# Patient Record
Sex: Male | Born: 1975 | Race: White | Hispanic: No | Marital: Married | Smoking: Never smoker
Health system: Southern US, Community
[De-identification: ages and names within clinical notes are randomized; demographics above are authoritative.]

## PROBLEM LIST (undated history)

## (undated) DIAGNOSIS — C801 Malignant (primary) neoplasm, unspecified: Secondary | ICD-10-CM

## (undated) DIAGNOSIS — Z8619 Personal history of other infectious and parasitic diseases: Secondary | ICD-10-CM

## (undated) DIAGNOSIS — Z8614 Personal history of Methicillin resistant Staphylococcus aureus infection: Secondary | ICD-10-CM

## (undated) DIAGNOSIS — G43909 Migraine, unspecified, not intractable, without status migrainosus: Secondary | ICD-10-CM

## (undated) HISTORY — DX: Malignant (primary) neoplasm, unspecified: C80.1

## (undated) HISTORY — DX: Personal history of Methicillin resistant Staphylococcus aureus infection: Z86.14

## (undated) HISTORY — DX: Personal history of other infectious and parasitic diseases: Z86.19

## (undated) HISTORY — DX: Migraine, unspecified, not intractable, without status migrainosus: G43.909

---

## 2003-09-16 DIAGNOSIS — C801 Malignant (primary) neoplasm, unspecified: Secondary | ICD-10-CM

## 2003-09-16 HISTORY — DX: Malignant (primary) neoplasm, unspecified: C80.1

## 2003-10-06 ENCOUNTER — Ambulatory Visit (HOSPITAL_COMMUNITY): Admission: RE | Admit: 2003-10-06 | Discharge: 2003-10-06 | Payer: Self-pay | Admitting: Urology

## 2003-10-06 ENCOUNTER — Ambulatory Visit (HOSPITAL_BASED_OUTPATIENT_CLINIC_OR_DEPARTMENT_OTHER): Admission: RE | Admit: 2003-10-06 | Discharge: 2003-10-06 | Payer: Self-pay | Admitting: Urology

## 2003-10-06 ENCOUNTER — Encounter (INDEPENDENT_AMBULATORY_CARE_PROVIDER_SITE_OTHER): Payer: Self-pay | Admitting: Specialist

## 2003-10-08 ENCOUNTER — Encounter: Admission: RE | Admit: 2003-10-08 | Discharge: 2003-10-08 | Payer: Self-pay | Admitting: Urology

## 2003-10-28 ENCOUNTER — Encounter: Admission: RE | Admit: 2003-10-28 | Discharge: 2003-10-28 | Payer: Self-pay | Admitting: Oncology

## 2003-11-03 ENCOUNTER — Ambulatory Visit (HOSPITAL_COMMUNITY): Admission: RE | Admit: 2003-11-03 | Discharge: 2003-11-03 | Payer: Self-pay | Admitting: Oncology

## 2003-11-16 ENCOUNTER — Ambulatory Visit: Payer: Self-pay | Admitting: Oncology

## 2004-01-03 ENCOUNTER — Encounter: Admission: RE | Admit: 2004-01-03 | Discharge: 2004-01-03 | Payer: Self-pay | Admitting: Oncology

## 2004-01-06 ENCOUNTER — Ambulatory Visit: Payer: Self-pay | Admitting: Oncology

## 2004-02-24 ENCOUNTER — Encounter: Admission: RE | Admit: 2004-02-24 | Discharge: 2004-02-24 | Payer: Self-pay | Admitting: Oncology

## 2004-03-01 ENCOUNTER — Ambulatory Visit: Payer: Self-pay | Admitting: Oncology

## 2004-04-20 ENCOUNTER — Encounter: Admission: RE | Admit: 2004-04-20 | Discharge: 2004-04-20 | Payer: Self-pay | Admitting: Oncology

## 2004-06-05 ENCOUNTER — Ambulatory Visit: Payer: Self-pay | Admitting: Oncology

## 2004-06-20 ENCOUNTER — Encounter: Admission: RE | Admit: 2004-06-20 | Discharge: 2004-06-20 | Payer: Self-pay | Admitting: Oncology

## 2004-08-07 ENCOUNTER — Ambulatory Visit: Payer: Self-pay | Admitting: Oncology

## 2004-09-28 ENCOUNTER — Encounter: Admission: RE | Admit: 2004-09-28 | Discharge: 2004-09-28 | Payer: Self-pay | Admitting: Oncology

## 2004-12-18 ENCOUNTER — Ambulatory Visit: Payer: Self-pay | Admitting: Oncology

## 2005-02-01 ENCOUNTER — Encounter: Admission: RE | Admit: 2005-02-01 | Discharge: 2005-02-01 | Payer: Self-pay | Admitting: Oncology

## 2005-04-17 ENCOUNTER — Ambulatory Visit: Payer: Self-pay | Admitting: Oncology

## 2005-04-18 LAB — COMPREHENSIVE METABOLIC PANEL
ALT: 11 U/L (ref 0–40)
AST: 14 U/L (ref 0–37)
Albumin: 4.9 g/dL (ref 3.5–5.2)
BUN: 21 mg/dL (ref 6–23)
CO2: 25 mEq/L (ref 19–32)
Total Protein: 7.5 g/dL (ref 6.0–8.3)

## 2005-06-03 IMAGING — CT CT PELVIS W/ CM
1 of 3 series · 14 of 32 positions shown, 19 images · IV contrast (GASTRO. & [ID] OMNI  300)
Comparison: none

CLINICAL DATA: Staging testicular cancer.
ABDOMINAL CT WITH CONTRAST ? 10/28/03:

[Series 2: routine abdomen · axial · 0.70mm/px · z∈[-335,+10]mm · 14 of 79 slices shown, 19 images]
[im 5/79  soft-tissue]
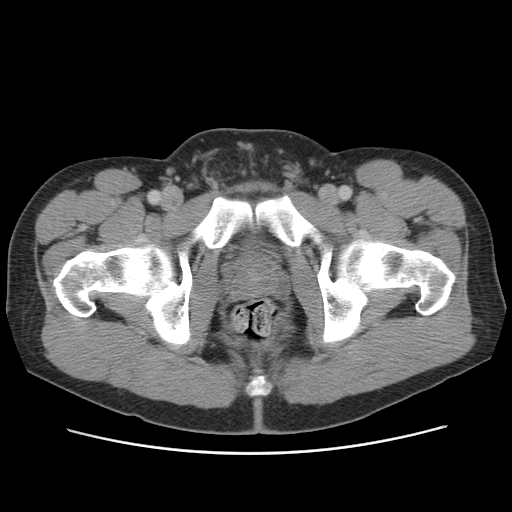
[im 5/79  bone]
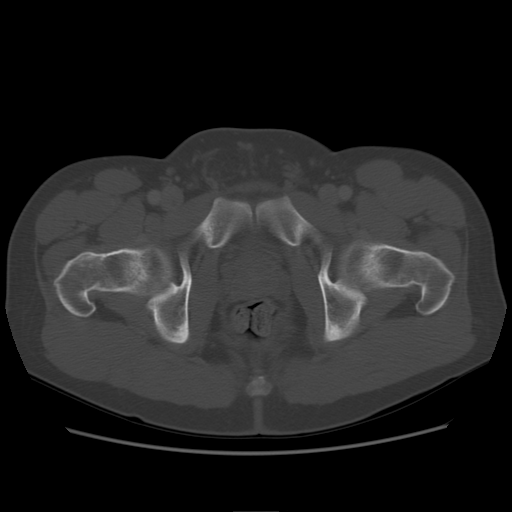
[im 9/79  soft-tissue]
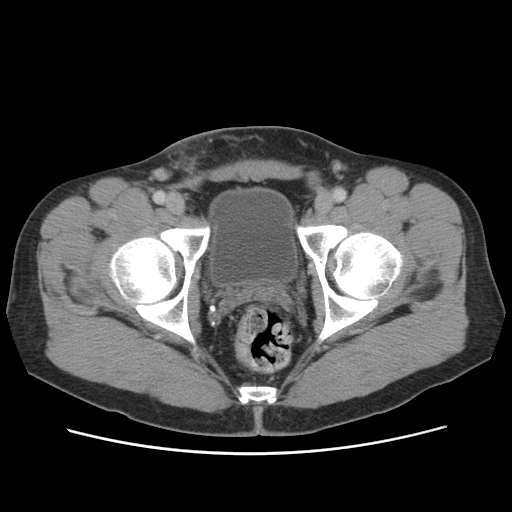
[im 18/79  soft-tissue]
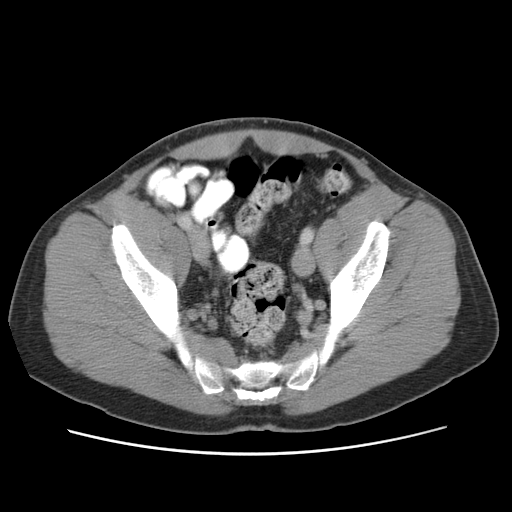
[im 22/79  soft-tissue]
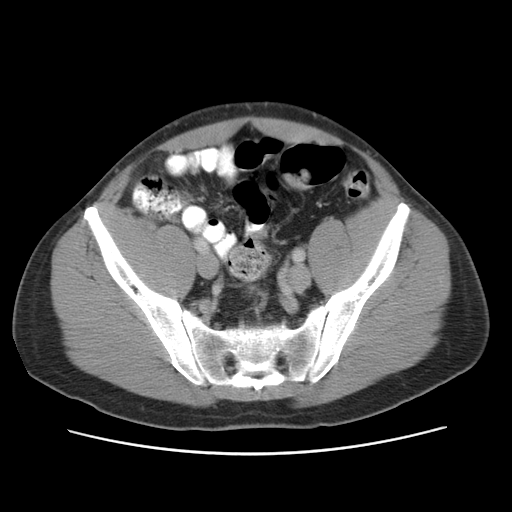
[im 27/79  soft-tissue]
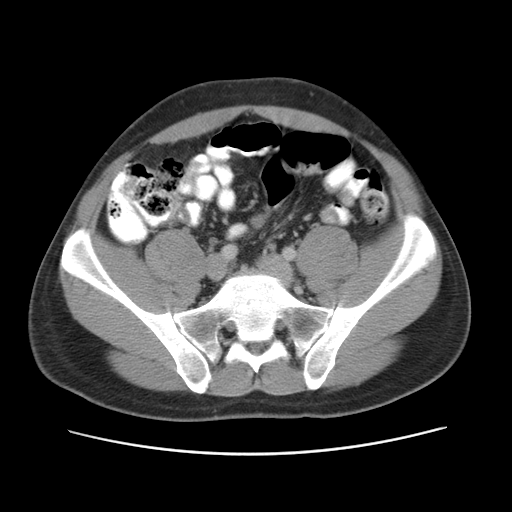
[im 35/79  soft-tissue]
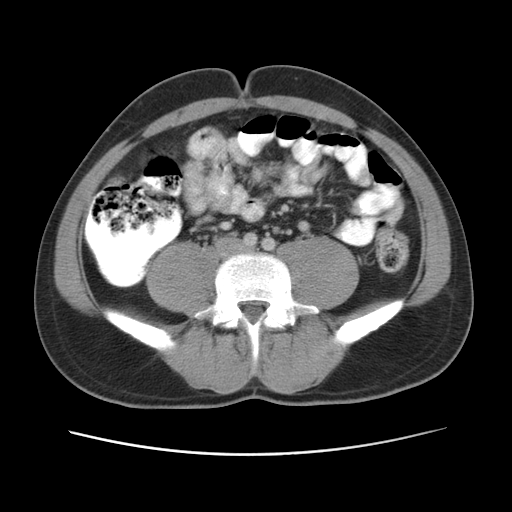
[im 40/79  soft-tissue]
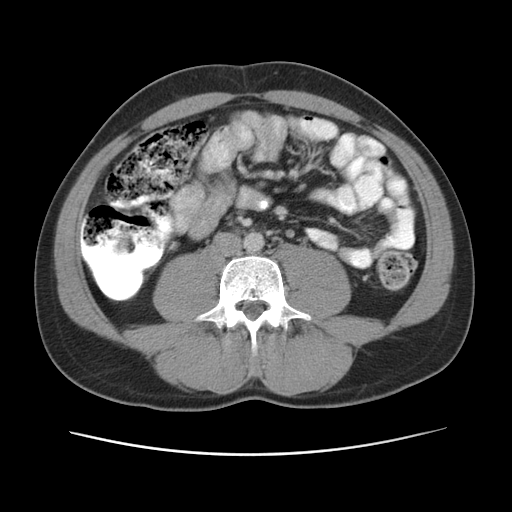
[im 44/79  soft-tissue]
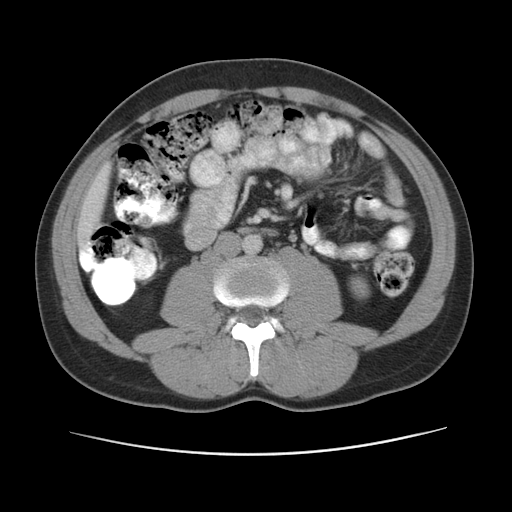
[im 53/79  soft-tissue]
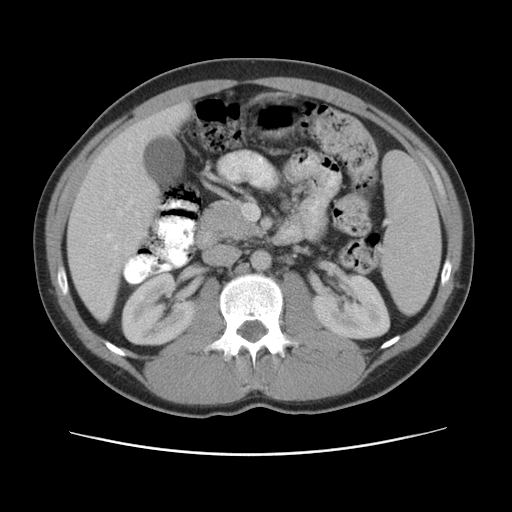
[im 53/79  bone]
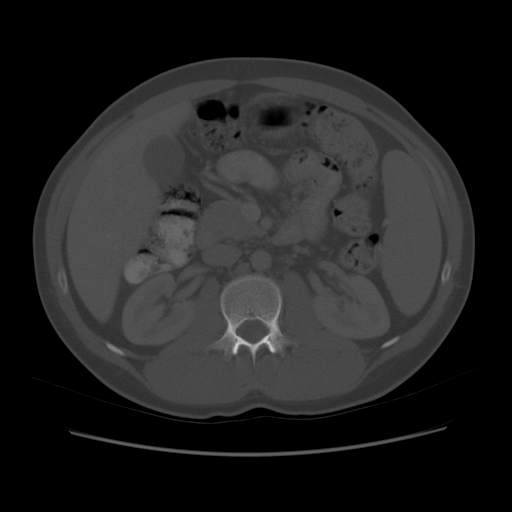
[im 57/79  soft-tissue]
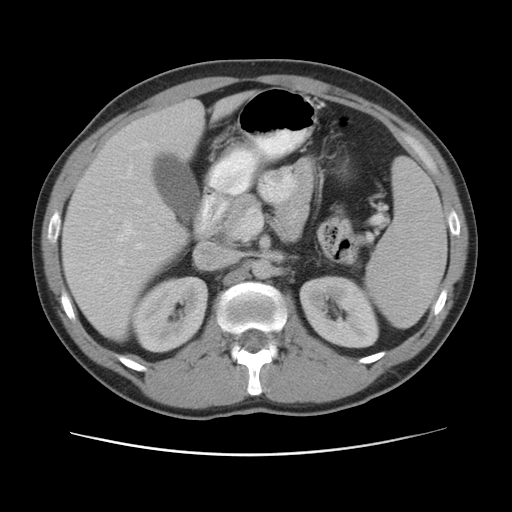
[im 61/79  soft-tissue]
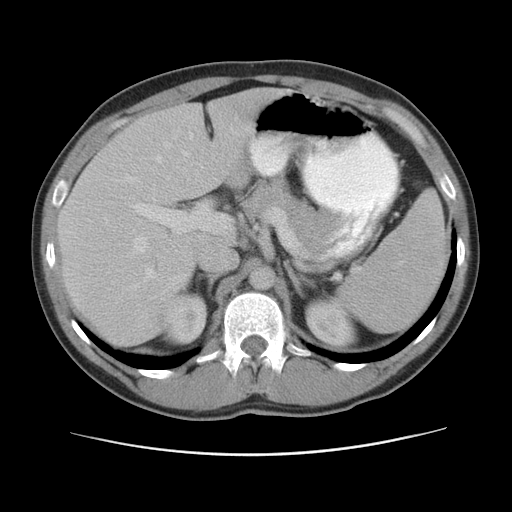
[im 61/79  lung]
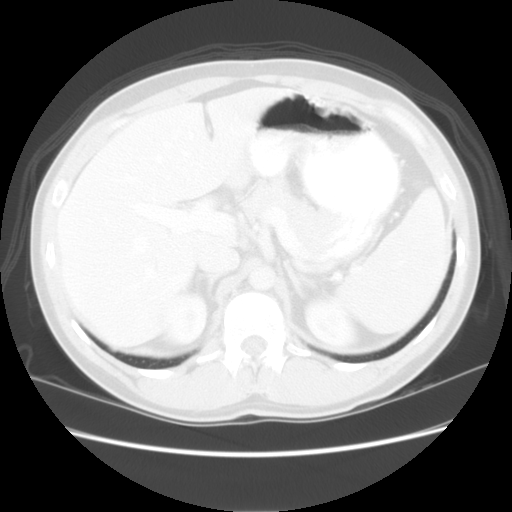
[im 66/79  lung]
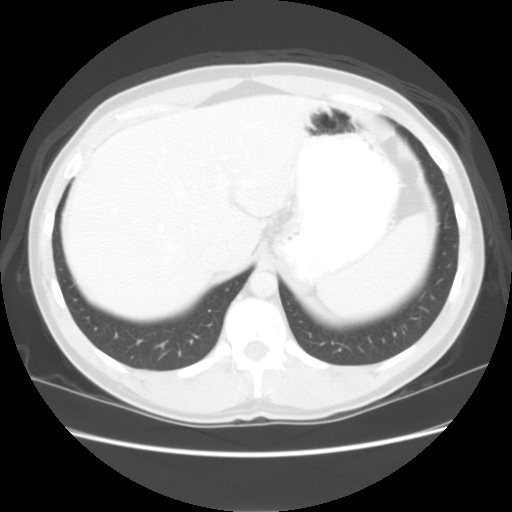
[im 70/79  soft-tissue]
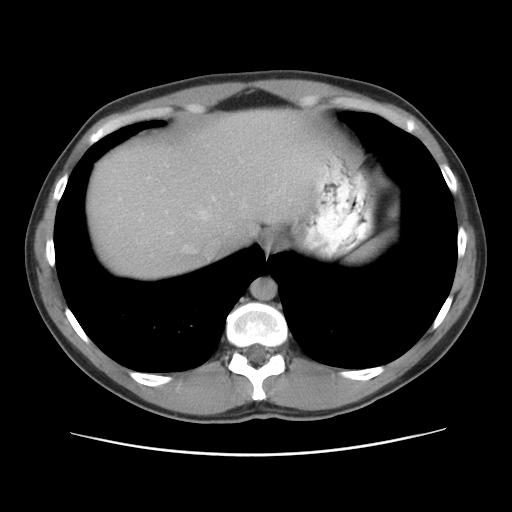
[im 70/79  lung]
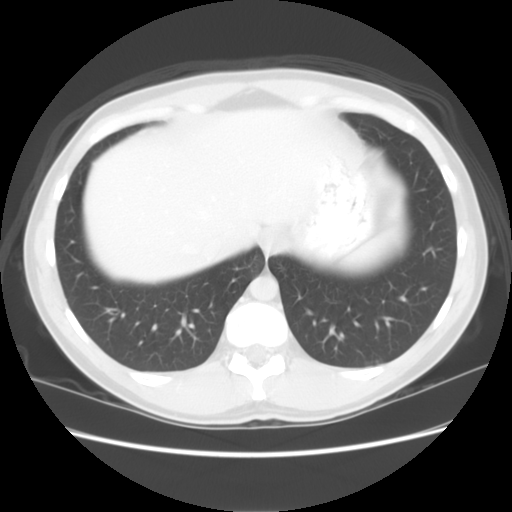
[im 74/79  soft-tissue]
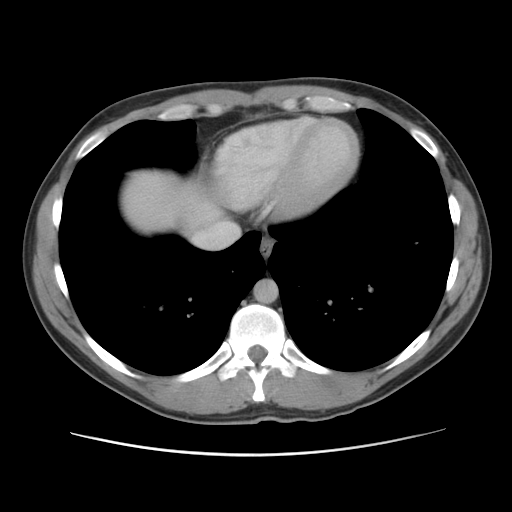
[im 74/79  lung]
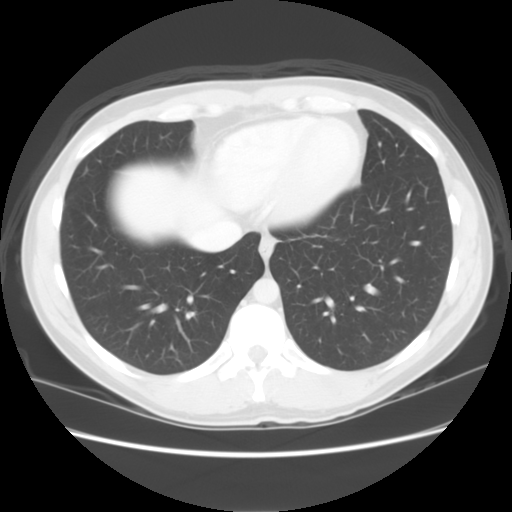

[14 of 32 positions shown; findings below may reference images not displayed]

FINDINGS: Following oral contrast and IV administration of 100 cc Omnipaque 300, multidetector spiral axial images were obtained through the abdomen and comparison made with previous abdominal CT of 10/08/03.  Since prior study, there is resolution of previous left periaortic adenopathy.  Marked regression in more inferior retroperitoneal adenopathy previously measured 1.4 x 1.9 cm and currently measures 0.6 cm AP by 1.2 cm wide (image 42).  No progressive adenopathy is seen.  Remaining abdominal organs appear normal.   No osseous lesion is seen.
IMPRESSION: Since 10/08/03:
[DATE].  Resolution of previous left retroperitoneal and marked regression of more inferior retroperitoneal adenopathy with no locally progressive nor new metastatic disease.
2.  Otherwise normal. 
PELVIC CT WITH CONTRAST ? 10/28/03:
FINDINGS: Routine spiral CT of the pelvis was performed. Omnipaque intravenous contrast and oral contrast were administered. 

The pelvic structures are normal in appearance. There is no evidence of masses, adenopathy, inflammatory process, or abnormal fluid collections.   esolution of recent postoperative changes due to prior right orchiectomy with no other significant change since 10/08/03.

IMPRESSION
Normal postoperative pelvis CT.

## 2005-07-13 ENCOUNTER — Ambulatory Visit: Payer: Self-pay | Admitting: Oncology

## 2005-07-17 LAB — COMPREHENSIVE METABOLIC PANEL
ALT: 14 U/L (ref 0–40)
Alkaline Phosphatase: 63 U/L (ref 39–117)
BUN: 18 mg/dL (ref 6–23)
Calcium: 9.4 mg/dL (ref 8.4–10.5)
Chloride: 104 mEq/L (ref 96–112)
Creatinine, Ser: 1.21 mg/dL (ref 0.40–1.50)
Sodium: 141 mEq/L (ref 135–145)
Total Bilirubin: 1.1 mg/dL (ref 0.3–1.2)

## 2005-07-19 ENCOUNTER — Encounter: Admission: RE | Admit: 2005-07-19 | Discharge: 2005-07-19 | Payer: Self-pay | Admitting: Oncology

## 2005-12-25 ENCOUNTER — Ambulatory Visit: Payer: Self-pay | Admitting: Oncology

## 2006-01-03 ENCOUNTER — Encounter: Admission: RE | Admit: 2006-01-03 | Discharge: 2006-01-03 | Payer: Self-pay | Admitting: Oncology

## 2006-01-05 LAB — BASIC METABOLIC PANEL
BUN: 16 mg/dL (ref 6–23)
CO2: 30 mEq/L (ref 19–32)
Calcium: 9.8 mg/dL (ref 8.4–10.5)

## 2006-06-25 ENCOUNTER — Ambulatory Visit: Payer: Self-pay | Admitting: Oncology

## 2006-06-29 LAB — COMPREHENSIVE METABOLIC PANEL
AST: 14 U/L (ref 0–37)
Alkaline Phosphatase: 57 U/L (ref 39–117)
BUN: 23 mg/dL (ref 6–23)
Calcium: 9.6 mg/dL (ref 8.4–10.5)
Chloride: 104 mEq/L (ref 96–112)
Creatinine, Ser: 1.32 mg/dL (ref 0.40–1.50)

## 2006-07-04 ENCOUNTER — Encounter: Admission: RE | Admit: 2006-07-04 | Discharge: 2006-07-04 | Payer: Self-pay | Admitting: Oncology

## 2006-12-24 ENCOUNTER — Ambulatory Visit: Payer: Self-pay | Admitting: Oncology

## 2006-12-25 ENCOUNTER — Encounter: Admission: RE | Admit: 2006-12-25 | Discharge: 2006-12-25 | Payer: Self-pay | Admitting: Oncology

## 2007-02-12 ENCOUNTER — Ambulatory Visit: Payer: Self-pay | Admitting: Oncology

## 2007-04-08 ENCOUNTER — Ambulatory Visit: Payer: Self-pay | Admitting: Oncology

## 2007-09-16 HISTORY — PX: RADICAL ORCHIECTOMY: SHX2285

## 2007-10-07 ENCOUNTER — Ambulatory Visit: Payer: Self-pay | Admitting: Oncology

## 2007-10-12 LAB — BASIC METABOLIC PANEL
CO2: 25 mEq/L (ref 19–32)
Chloride: 103 mEq/L (ref 96–112)
Glucose, Bld: 89 mg/dL (ref 70–99)
Potassium: 3.7 mEq/L (ref 3.5–5.3)
Sodium: 140 mEq/L (ref 135–145)

## 2007-10-12 LAB — AFP TUMOR MARKER: AFP-Tumor Marker: 4.6 ng/mL (ref 0.0–8.0)

## 2008-04-06 ENCOUNTER — Ambulatory Visit: Payer: Self-pay | Admitting: Oncology

## 2008-09-24 ENCOUNTER — Encounter: Admission: RE | Admit: 2008-09-24 | Discharge: 2008-09-24 | Payer: Self-pay

## 2008-10-04 ENCOUNTER — Ambulatory Visit: Payer: Self-pay | Admitting: Oncology

## 2009-04-01 ENCOUNTER — Ambulatory Visit: Payer: Self-pay | Admitting: Oncology

## 2009-04-15 DIAGNOSIS — Z8614 Personal history of Methicillin resistant Staphylococcus aureus infection: Secondary | ICD-10-CM

## 2009-04-15 HISTORY — DX: Personal history of Methicillin resistant Staphylococcus aureus infection: Z86.14

## 2009-11-14 ENCOUNTER — Ambulatory Visit: Payer: Self-pay | Admitting: Oncology

## 2010-02-04 ENCOUNTER — Encounter: Payer: Self-pay | Admitting: Oncology

## 2010-02-05 ENCOUNTER — Encounter: Payer: Self-pay | Admitting: Oncology

## 2010-06-02 NOTE — Op Note (Signed)
NAMELLEWYN, HEAP                 ACCOUNT NO.:  [id number]   MEDICAL RECORD NO.:  [id number]          PATIENT TYPE:  AMB   LOCATION:  [facility]                         FACILITY:  [facility]   PHYSICIAN:  Valetta Fuller, M.D.  DATE OF BIRTH:  1975/05/14   DATE OF PROCEDURE:  10/06/2003  DATE OF DISCHARGE:                                 OPERATIVE REPORT   PREOPERATIVE DIAGNOSIS:  Right testicular mass.   POSTOPERATIVE DIAGNOSIS:  Right testicular mass.   PROCEDURE PERFORMED:  Right radical orchiectomy.   SURGEON:  Claudette Laws, M.D.   RESIDENT:  Thyra Breed, M.D.   ANESTHESIA:  General endotracheal anesthesia.   SPECIMENS:  Right testicle and cord structures.   DRAINS:  None.   ESTIMATED BLOOD LOSS:  Less than 10 mL.   COMPLICATIONS:  None.   INDICATIONS FOR PROCEDURE:  Mr. Mcconville is a pleasant 35 year old male who  noticed a hardened testicular mass which has grown slightly for the past  couple of months.  He first noted some pain in his right hemiscrotum as he  is a long-distance cycler.  On office evaluation, he was found to have a  hardened testicular mass on the right encompassing 2/3 of the normal size of  the testicle.  An ultrasound showed a rather heterogeneous appearance of the  mass which did appear to be well encapsulated by the tunica albuginea.  Tumor markers have been drawn but are unavailable at this time for review.  Mr. Vandermeulen is set up for a right radical orchiectomy through an inguinal  incision.  All the risks, benefits, and alternatives are described in  detail.  This includes bleeding, infection, damage to adjacent structures,  failure to the procedure, need for future reoperation, loss of fertility, as  well as the risks of anesthesia.  Informed consent is obtained and he is  willing to proceed.   PROCEDURE IN DETAIL:  Following identification by his arm band, the patient  was brought to the operating room and placed in supine position.  Here, he  underwent successful induction of general endotracheal anesthesia and  received preoperative IV antibiotics.  His right inguinal region was then  shaved.  His entire lower abdomen as well as his perineum and genitalia were  then prepped with Betadine and draped in the usual sterile fashion.  We  created an approximately 4 cm semilunar incision in the right inguinal  region just overlying the superficial inguinal ring.  Bovie electrocautery  was used to carry the incision down through Camper's and Scarpa's fascia.  Army-Navy retractors were then used to expose the shelving edge of the  fascia and the cord was seen to be exiting through the external ring.  A  right inguinal clamp was then used to elevate the ring.  Metzenbaum scissors  were used to sharply incise the ring opening at approximately 2 cm to allow  delivery of the cord.  At this time, using a Kitner and a hemostat to  elevate the fascial edge, the cord structures were swept from the fascia  until a right angle clamp was  easily passed between all cord structures.  Care was taken throughout this to avoid injury to the ilioinguinal nerve  which was visualized during the entire process.  A Penrose drain was then  used to doubly ligate the cord as it exited the ring.  The right hemiscrotum  was then inverted and the testicle was delivered into the inguinal incision.  All remaining gubernacular attachments were then taken down using the Bovie  electrocautery.  Finally, the testicle was free and a Ray-Tek sponge was  used to sweep any remaining cremasteric fibers holding the testicle in  place.  We, again, made sure the nerve was well away from the bundle.  The  cord was taken at a proximal point almost to its entry into the internal  ring.  The vas was separated thus allowing two bundles for ligation.  One  bundle contained the vas and the other contained the cord vessels.  Two  hemostats were then placed on the cord vessels as well as  the vas.  The  specimen was then transected distal to the hemostats with care taken to  avoid any spilling of blood or contents from the cord.  The specimen was  then passed from the field to be sent for further pathologic analysis.  There was no gross evidence of tumor extension, however, the testicle was  quite hardened and irregular.  At this time, we used a 0 silk suture to  doubly ligate the vas.  The hemostat was removed.  We then performed double  suture ligation of the remaining cord structures as well as a third 0 silk  suture tie to triply ligate the cord structures with excellent hemostasis.  The silk suture was left long as the remaining cord retracted into the ring.  The wound was then copiously irrigated.  A 2-0 Vicryl suture was used in a  running fashion to close the fascia, care was taken not to incorporate the  nerve as it was in plain vision during the entire process.  Hemostasis was  excellent at this time.  We closed the deep Scarpa's fascia using running 2-  0 Vicryl suture.  The skin was closed in a subcuticular fashion using 4-0  Vicryl suture.  The incision was washed and dried.  Benzoin was applied  followed by Steri-Strips.  The dressing was completed with a sterile 2 by 2  and a Tegaderm dressing.  The patient tolerated the procedure well and there  were no complications.  All sponge, needle, and instrument counts were  correct x 2.  Please note that Dr. Isabel Caprice was present and participated in  the entire procedure as he was the responsible surgeon.   DISPOSITION:  After waking from general anesthesia, the patient was  transferred to the post anesthesia care unit in stable condition.  From  here, he will be discharged to home.  He was given a prescription for  Percocet 5/325, #30, with one refill.  Dr. Ellin Goodie office is to call with a  return appointment.  However, the patient is given the number to the office  at [phone].     EG/MEDQ  D:  10/06/2003  T:   10/06/2003  Job:  [id number]

## 2010-11-17 ENCOUNTER — Telehealth: Payer: Self-pay | Admitting: *Deleted

## 2011-02-26 ENCOUNTER — Encounter: Payer: Self-pay | Admitting: *Deleted

## 2011-02-27 ENCOUNTER — Ambulatory Visit: Payer: Self-pay | Admitting: Oncology

## 2011-02-27 ENCOUNTER — Other Ambulatory Visit: Payer: Self-pay | Admitting: Lab

## 2016-10-05 ENCOUNTER — Encounter: Payer: Self-pay | Admitting: Sports Medicine

## 2016-10-05 ENCOUNTER — Ambulatory Visit (INDEPENDENT_AMBULATORY_CARE_PROVIDER_SITE_OTHER): Payer: PRIVATE HEALTH INSURANCE | Admitting: Sports Medicine

## 2016-10-05 ENCOUNTER — Ambulatory Visit: Payer: Self-pay

## 2016-10-05 VITALS — BP 120/90 | HR 70 | Ht 68.0 in | Wt 191.8 lb

## 2016-10-05 DIAGNOSIS — M25511 Pain in right shoulder: Secondary | ICD-10-CM | POA: Diagnosis not present

## 2016-10-05 DIAGNOSIS — M75101 Unspecified rotator cuff tear or rupture of right shoulder, not specified as traumatic: Secondary | ICD-10-CM

## 2016-10-05 MED ORDER — NITROGLYCERIN 0.2 MG/HR TD PT24
MEDICATED_PATCH | TRANSDERMAL | 1 refills | Status: DC
Start: 2016-10-05 — End: 2023-01-31

## 2016-10-05 NOTE — Progress Notes (Signed)
OFFICE VISIT NOTE Dakota Golden. Dakota Golden, [facility] [place] - 41 y.o. male MRN [id number]  Date of birth: May 26, 1975  Visit Date: 10/05/2016  PCP: No primary care provider on file.   Referred by: No ref. provider found  Dakota Golden, CMA acting as scribe for Dakota Golden.  SUBJECTIVE:   Chief Complaint  Patient presents with  . New Patient (Initial Visit)    RT shoulder pain   HPI: As below and per problem based documentation when appropriate.  Dakota Golden is a new patient presenting today for evaluation of RT shoulder pain.  Pain has been present x 3 weeks.  He was in his car lifting something into the back seat when the pain first started.   The pain is described as pinching and is rated as 6/10. Pt reports that's its more uncomfortable than anything else.   Worsened with lifting things, raising arm parallel to the floor.  Improves with rest Therapies tried include : He has tried taking Ibuprofen TID which gives him short term relief. He has also tried icing the shoulder with some relief.   Other associated symptoms include: Pain radiates toward the shoulder blade and RT side of the chest.     Review of Systems  Constitutional: Negative for chills and fever.  Respiratory: Negative for shortness of breath and wheezing.   Cardiovascular: Negative for chest pain and palpitations.  Musculoskeletal: Positive for joint pain. Negative for falls.  Neurological: Negative for dizziness, tingling and headaches.  Endo/Heme/Allergies: Does not bruise/bleed easily.    Otherwise per HPI.  HISTORY & PERTINENT PRIOR DATA:  No specialty comments available. He reports that he has never smoked. He has never used smokeless tobacco. No results for input(s): HGBA1C, LABURIC in the last 8760 hours. Medications & Allergies reviewed per EMR Patient Active Problem List   Diagnosis Date Noted  . Rotator cuff syndrome of right  shoulder 10/05/2016   Past Medical History:  Diagnosis Date  . H/O infectious mononucleosis    age 16  . Headache, migraine   . 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 Past Surgical History:  Procedure Laterality Date  . RADICAL ORCHIECTOMY  09/2007   Right   Social History   Occupational History  . Not on file.   Social History Main Topics  . Smoking status: Never Smoker  . Smokeless tobacco: Never Used  . Alcohol use Not on file  . Drug use: Unknown  . Sexual activity: Not on file    OBJECTIVE:  VS:  HT:5\' 8"  (172.7 cm)   WT:191 lb 12.8 oz (87 kg)  BMI:29.17    BP:120/90  HR:70bpm  TEMP: ( )  RESP:95 % EXAM: Findings:  WDWN, NAD, Non-toxic appearing Alert & appropriately interactive Not depressed or anxious appearing No increased work of breathing. Pupils are equal. EOM intact without nystagmus No clubbing or cyanosis of the extremities appreciated No significant rashes/lesions/ulcerations overlying the examined area. Radial pulses 2+/4.  No significant generalized UE edema. Sensation intact to light touch in upper extremities.  Right Shoulder Exam: Normal alignment, Normal Contours No overlying erythema/ecchymosis. No pain or crepitation with axial loading and circumduction TTP over: Periscapular musculature, no focal bony tenderness No TTP over: Bony landmarks Internal Rotation: Normal External Rotation: Normal Empty can: Small amount of pain, no weakness Hawkins: Normal Neers:  Normal Speeds:Normal O'Brien's: Normal      LIMITED MSK ULTRASOUND OF RIGHT SHOULDER Images were obtained and interpreted by myself, Dakota Coombs, DO  Images have been saved and stored to PACS system. Images obtained on: GE S7 Ultrasound machine  FINDINGS:  Biceps Tendon: Normal Pec Major Insertion: Normal Subscapularis Tendon: Normal Supraspinatus Tendon: Small interstitial  split tear with hypoechoic change and minimal neovascularity.  Small bursal layer that is  minimal. Infraspinatus/Teres Minor Tendon: Normal AC Joint: Normal JOINT: No significant GH spurring appreciated  LABRUM: No appreciable tear, incompletely evaluated    IMPRESSION:  1. Small interstitial tear of the right supraspinatus with a small bursal layer  ASSESSMENT & PLAN:     ICD-10-CM   1. Acute pain of right shoulder M25.511 [place] LIMITED JOINT SPACE STRUCTURES UP RIGHT(NO LINKED CHARGES)  2. Rotator cuff syndrome of right shoulder M75.101    ================================================================= Rotator cuff syndrome of right shoulder Nitro protocol Caution with headaches discussed TherEx with Scap Stab and WITY + intrinsic   ================================================================= Patient Instructions  Also check out [employer]" which is a program developed by Dakota Golden.   There are links to a couple of his YouTube Videos below and I would like to see performing one of his videos 5-6 days per week.    A good intro video is: "Independence from Pain 7-minute Video" - [email or web address]   His more advanced video is: "Powerful Posture and Pain Relief: 12 minutes of Foundation Training" - https://youtu.be/[email or web address]  Do not try to attempt this entire video when first beginning.    Try breaking of each exercise that he goes into shorter segments.  Otherwise if they perform an exercise for 45 seconds, start with 15 seconds and rest and then resume when they begin the new activity.    If you work your way up to doing this 12 minute video, I expect you will see significant improvements in your pain.  If you enjoy his videos and would like to find out more you can look on his website: [email or web address]  He has a workout streaming option as well as a DVD set available for purchase.  Amazon has the best price for his DVDs.      Nitroglycerin Protocol   Apply 1/4 nitroglycerin patch to affected area daily.  Change position of patch within the affected area every 24 hours.  You may experience a headache during the first 1-2 weeks of using the patch, these should subside.  If you experience headaches after beginning nitroglycerin patch treatment, you may take your preferred over the counter pain reliever.  Another side effect of the nitroglycerin patch is skin irritation or rash related to patch adhesive.  Please notify our office if you develop more severe headaches or rash, and stop the patch.  Tendon healing with nitroglycerin patch may require 12 to 24 weeks depending on the extent of injury.  Men should not use if taking Viagra, Cialis, or Levitra.   Do not use if you have migraines or rosacea.     ================================================================= Future Appointments Date Time Provider [place]  11/16/2016 8:40 AM Gerda Diss, DO [facility] None    Follow-up: Return in about 6 weeks (around 11/16/2016).   CMA/ATC served as Education administrator during this visit. History, Physical, and Plan performed by medical provider. Documentation and orders reviewed and attested to.      Dakota Golden, [place] [facility] Physician

## 2016-10-05 NOTE — Assessment & Plan Note (Signed)
Nitro protocol Caution with headaches discussed TherEx with Scap Stab and WITY + intrinsic

## 2016-10-05 NOTE — Patient Instructions (Signed)
Also check out [employer]" which is a program developed by Dr. Minerva Ends.   There are links to a couple of his YouTube Videos below and I would like to see performing one of his videos 5-6 days per week.    A good intro video is: "Independence from Pain 7-minute Video" - [email or web address]   His more advanced video is: "Powerful Posture and Pain Relief: 12 minutes of Foundation Training" - https://youtu.be/[email or web address]  Do not try to attempt this entire video when first beginning.    Try breaking of each exercise that he goes into shorter segments.  Otherwise if they perform an exercise for 45 seconds, start with 15 seconds and rest and then resume when they begin the new activity.    If you work your way up to doing this 12 minute video, I expect you will see significant improvements in your pain.  If you enjoy his videos and would like to find out more you can look on his website: [email or web address]  He has a workout streaming option as well as a DVD set available for purchase.  Amazon has the best price for his DVDs.     Nitroglycerin Protocol   Apply 1/4 nitroglycerin patch to affected area daily.  Change position of patch within the affected area every 24 hours.  You may experience a headache during the first 1-2 weeks of using the patch, these should subside.  If you experience headaches after beginning nitroglycerin patch treatment, you may take your preferred over the counter pain reliever.  Another side effect of the nitroglycerin patch is skin irritation or rash related to patch adhesive.  Please notify our office if you develop more severe headaches or rash, and stop the patch.  Tendon healing with nitroglycerin patch may require 12 to 24 weeks depending on the extent of injury.  Men should not use if taking Viagra, Cialis, or Levitra.   Do not use if you have migraines or rosacea.

## 2016-11-16 ENCOUNTER — Ambulatory Visit: Payer: PRIVATE HEALTH INSURANCE | Admitting: Sports Medicine

## 2016-11-16 DIAGNOSIS — Z0289 Encounter for other administrative examinations: Secondary | ICD-10-CM

## 2018-03-31 ENCOUNTER — Telehealth: Payer: Self-pay | Admitting: *Deleted

## 2018-03-31 NOTE — Telephone Encounter (Signed)
Medical records faxed to [place]; [place] [phone]

## 2018-07-21 ENCOUNTER — Other Ambulatory Visit: Payer: Self-pay | Admitting: Urology

## 2018-07-21 DIAGNOSIS — Z8547 Personal history of malignant neoplasm of testis: Secondary | ICD-10-CM

## 2018-07-28 ENCOUNTER — Other Ambulatory Visit: Payer: PRIVATE HEALTH INSURANCE

## 2018-07-30 ENCOUNTER — Ambulatory Visit
Admission: RE | Admit: 2018-07-30 | Discharge: 2018-07-30 | Disposition: A | Discharge status: Home / Self Care | Payer: PRIVATE HEALTH INSURANCE | Source: Ambulatory Visit | Attending: Urology | Admitting: Urology

## 2018-07-30 DIAGNOSIS — Z8547 Personal history of malignant neoplasm of testis: Secondary | ICD-10-CM

## 2021-05-30 ENCOUNTER — Encounter: Payer: Self-pay | Admitting: Orthopaedic Surgery

## 2021-05-30 ENCOUNTER — Ambulatory Visit (INDEPENDENT_AMBULATORY_CARE_PROVIDER_SITE_OTHER): Payer: No Typology Code available for payment source | Admitting: Orthopaedic Surgery

## 2021-05-30 VITALS — Ht 69.0 in | Wt 209.0 lb

## 2021-05-30 DIAGNOSIS — M25562 Pain in left knee: Secondary | ICD-10-CM

## 2021-05-30 NOTE — Progress Notes (Signed)
? ?Office Visit Note ?  ?Patient: Dakota Golden           ?Date of Birth: 1976-01-04           ?MRN: [id number] ?Visit Date: 05/30/2021 ?             ?Requested by: No referring provider defined for this encounter. ?PCP: Patient, No Pcp Per (Inactive) ? ? ?Assessment & Plan: ?Visit Diagnoses:  ?1. Acute pain of left knee   ? ? ?Plan: Based on findings impression is partial patellar tendon rupture.  He does have a large joint effusion which is concerning for structural abnormalities therefore we will order an MRI.  Follow-up after the MRI. ? ?Follow-Up Instructions: No follow-ups on file.  ? ?Orders:  ?Orders Placed This Encounter  ?Procedures  ? MR Knee Left w/o contrast  ? ?No orders of the defined types were placed in this encounter. ? ? ? ? Procedures: ?No procedures performed ? ? ?Clinical Data: ?No additional findings. ? ? ?Subjective: ?Chief Complaint  ?Patient presents with  ? Left Knee - Pain  ? ? ?HPI ? 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  For the last 6 weeks he has been in a [employer].  He has noticed increasing pain to the front of the knee without any specific injuries.  After the game last week he had severe pain and difficulty with flexion and weightbearing and it continued to get worse.  He was evaluated at the [facility] urgent care on Sunday and had normal x-rays but was felt to have a partial patella tendon tear.  He is only gotten slightly better in terms of symptoms.  He could barely drive. ? ?Review of Systems  ?Constitutional: Negative.   ?All other systems reviewed and are negative. ? ? ?Objective: ?Vital Signs: Ht '5\' 9"'$  (1.753 m)   Wt 209 lb (94.8 kg)   BMI 30.86 kg/m?  ? ?Physical Exam ?Vitals and nursing note reviewed.  ?Constitutional:   ?   Appearance: He is well-developed.  ?HENT:  ?   Head: Normocephalic  and atraumatic.  ?Eyes:  ?   Pupils: Pupils are equal, round, and reactive to light.  ?Pulmonary:  ?   Effort: Pulmonary effort is normal.  ?Abdominal:  ?   Palpations: Abdomen is soft.  ?Musculoskeletal:     ?   General: Normal range of motion.  ?   Cervical back: Neck supple.  ?Skin: ?   General: Skin is warm.  ?Neurological:  ?   Mental Status: He is alert and oriented to person, place, and time.  ?Psychiatric:     ?   Behavior: Behavior normal.     ?   Thought Content: Thought content normal.     ?   Judgment: Judgment normal.  ? ? ?Ortho Exam ? ?Examination left knee shows a joint effusion and severe tenderness to the proximal patellar tendon.  No joint line tenderness.  Collaterals and cruciates are stable.  Limited range of motion secondary to pain and guarding. ? ?Specialty Comments:  ?No specialty comments available. ? ?Imaging: ?No results found. ? ? ?PMFS History: ?Patient Active Problem List  ? Diagnosis Date Noted  ? Rotator cuff syndrome of right shoulder 10/05/2016  ? ?Past Medical History:  ?Diagnosis Date  ? H/O infectious mononucleosis   ?  age 51  ? Headache, migraine   ? Hx MRSA infection 04/2009  ? community aquired--thigh  ? Testicular Cancer 09/2003  ? Mixed germ cell  ?  ?No family history on file.  ?Past Surgical History:  ?Procedure Laterality Date  ? RADICAL ORCHIECTOMY  09/2007  ? Right  ? ?Social History  ? ?Occupational History  ? Not on file  ?Tobacco Use  ? Smoking status: Never  ? Smokeless tobacco: Never  ?Substance and Sexual Activity  ? Alcohol use: Not on file  ? Drug use: Not on file  ? Sexual activity: Not on file  ? ? ? ? ? ? ?

## 2021-06-06 ENCOUNTER — Encounter: Payer: Self-pay | Admitting: Orthopaedic Surgery

## 2021-06-06 ENCOUNTER — Telehealth: Payer: Self-pay | Admitting: Orthopaedic Surgery

## 2021-06-06 NOTE — Telephone Encounter (Signed)
Patient sent Mychart message

## 2021-06-06 NOTE — Telephone Encounter (Signed)
Patient called. He would like to know if Dr. Erlinda Hong will be calling him with the results from the MRI? His call back number is [phone]

## 2021-06-06 NOTE — Telephone Encounter (Signed)
Has he had his MRI yet?

## 2021-06-06 NOTE — Telephone Encounter (Signed)
Per Notes he had it done at [facility]-06/05/2021.

## 2021-06-09 ENCOUNTER — Ambulatory Visit: Payer: BLUE CROSS/BLUE SHIELD | Admitting: Orthopaedic Surgery

## 2022-07-16 ENCOUNTER — Ambulatory Visit: Payer: No Typology Code available for payment source | Admitting: Dermatology

## 2022-07-25 ENCOUNTER — Encounter: Payer: Self-pay | Admitting: Dermatology

## 2022-07-25 ENCOUNTER — Ambulatory Visit (INDEPENDENT_AMBULATORY_CARE_PROVIDER_SITE_OTHER): Payer: No Typology Code available for payment source | Admitting: Dermatology

## 2022-07-25 VITALS — BP 140/90 | HR 72

## 2022-07-25 DIAGNOSIS — L814 Other melanin hyperpigmentation: Secondary | ICD-10-CM

## 2022-07-25 DIAGNOSIS — W908XXA Exposure to other nonionizing radiation, initial encounter: Secondary | ICD-10-CM | POA: Diagnosis not present

## 2022-07-25 DIAGNOSIS — L821 Other seborrheic keratosis: Secondary | ICD-10-CM

## 2022-07-25 DIAGNOSIS — Z1283 Encounter for screening for malignant neoplasm of skin: Secondary | ICD-10-CM | POA: Diagnosis not present

## 2022-07-25 DIAGNOSIS — L578 Other skin changes due to chronic exposure to nonionizing radiation: Secondary | ICD-10-CM

## 2022-07-25 DIAGNOSIS — D229 Melanocytic nevi, unspecified: Secondary | ICD-10-CM

## 2022-07-25 DIAGNOSIS — D1801 Hemangioma of skin and subcutaneous tissue: Secondary | ICD-10-CM

## 2022-07-25 NOTE — Progress Notes (Signed)
   New Patient Visit   Subjective  Dakota Golden is a 47 y.o. male who presents for the following: Skin Cancer Screening and Full Body Skin Exam.  Pt has a spot on left chest that is an odd color. He doesn't know how long it's been there and it isn't symptomatic. Pt has no hx of skin cancer and no family hx of skin cancer.  The patient presents for Total-Body Skin Exam (TBSE) for skin cancer screening and mole check. The patient has spots, moles and lesions to be evaluated, some may be new or changing and the patient may have concern these could be cancer.    The following portions of the chart were reviewed this encounter and updated as appropriate: medications, allergies, medical history  Review of Systems:  No other skin or systemic complaints except as noted in HPI or Assessment and Plan.  Objective  Well appearing patient in no apparent distress; mood and affect are within normal limits.  A full examination was performed including scalp, head, eyes, ears, nose, lips, neck, chest, axillae, abdomen, back, buttocks, bilateral upper extremities, bilateral lower extremities, hands, feet, fingers, toes, fingernails, and toenails. All findings within normal limits unless otherwise noted below.   Relevant physical exam findings are noted in the Assessment and Plan.    Assessment & Plan   SKIN CANCER SCREENING PERFORMED TODAY.  ACTINIC DAMAGE - Chronic condition, secondary to cumulative UV/sun exposure - diffuse scaly erythematous macules with underlying dyspigmentation - Recommend daily broad spectrum sunscreen SPF 30+ to sun-exposed areas, reapply every 2 hours as needed.  - Staying in the shade or wearing long sleeves, sun glasses (UVA+UVB protection) and wide brim hats (4-inch brim around the entire circumference of the hat) are also recommended for sun protection.  - Call for new or changing lesions.  LENTIGINES,  SEBORRHEIC KERATOSES left upper chest   HEMANGIOMAS - Benign  normal skin lesions - Benign-appearing - Call for any changes  MELANOCYTIC NEVI - Tan-brown and/or pink-flesh-colored symmetric macules and papules - Benign appearing on exam today - Observation - Call clinic for new or changing moles - Recommend daily use of broad spectrum spf 30+ sunscreen to sun-exposed areas.   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  Owens Shark, CMA, am acting as scribe for [employer], DO.   Documentation: I have reviewed the above documentation for accuracy and completeness, and I agree with the above.  Langston Reusing, DO

## 2022-07-25 NOTE — Patient Instructions (Addendum)
Thank you for visiting our clinic today. I appreciate your proactive approach in managing your skin health. Here is a summary of the key points from our consultation:  - Sunscreen Use: Continue applying sunscreen when outdoors, especially at the beach. It is crucial to reapply sunscreen regularly to maintain protection.  - Observation of Skin Changes: The white spot you mentioned is a seborrheic keratosis, which is benign and has no malignant potential. Monitor your skin for any new or changing spots and report these changes promptly.  - Annual Skin Checks: Schedule yearly skin examinations to monitor for any significant changes or developments.  - Skin Care Samples: A goody bag with various sunscreen samples has been provided for you to try and find what best suits your skin type.  Please remember, an ounce of prevention is worth a pound of cure. Your diligence in observing and caring for your skin can make a significant difference in your long-term health.  If you have any questions or notice any concerning changes before your next scheduled visit, do not hesitate to contact our office.   Due to recent changes in healthcare laws, you may see results of your pathology and/or laboratory studies on MyChart before the doctors have had a chance to review them. We understand that in some cases there may be results that are confusing or concerning to you. Please understand that not all results are received at the same time and often the doctors may need to interpret multiple results in order to provide you with the best plan of care or course of treatment. Therefore, we ask that you please give [place] 2 business days to thoroughly review all your results before contacting the office for clarification. Should we see a critical lab result, you will be contacted sooner.   If You Need Anything After Your Visit  If you have any questions or concerns for your doctor, please call our main line at [phone]  If no one answers, please leave a voicemail as directed and we will return your call as soon as possible. Messages left after 4 pm will be answered the following business day.   You may also send [place] a message via MyChart. We typically respond to MyChart messages within 1-2 business days.  For prescription refills, please ask your pharmacy to contact our office. Our fax number is [phone].  If you have an urgent issue when the clinic is closed that cannot wait until the next business day, you can page your doctor at the number below.    Please note that while we do our best to be available for urgent issues outside of office hours, we are not available 24/7.   If you have an urgent issue and are unable to reach [place], you may choose to seek medical care at your doctor's office, retail clinic, urgent care center, or emergency room.  If you have a medical emergency, please immediately call 911 or go to the emergency department. In the event of inclement weather, please call our main line at [phone] for an update on the status of any delays or closures.  Dermatology Medication Tips: Please keep the boxes that topical medications come in in order to help keep track of the instructions about where and how to use these. Pharmacies typically print the medication instructions only on the boxes and not directly on the medication tubes.   If your medication is too expensive, please contact our office at [phone] or send [place] a message through MyChart.  We are unable to tell what your co-pay for medications will be in advance as this is different depending on your insurance coverage. However, we may be able to find a substitute medication at lower cost or fill out paperwork to get insurance to cover a needed medication.   If a prior authorization is required to get your medication covered by your insurance company, please allow [place] 1-2 business days to complete this process.  Drug prices often  vary depending on where the prescription is filled and some pharmacies may offer cheaper prices.  The website www.goodrx.com contains coupons for medications through different pharmacies. The prices here do not account for what the cost may be with help from insurance (it may be cheaper with your insurance), but the website can give you the price if you did not use any insurance.  - You can print the associated coupon and take it with your prescription to the pharmacy.  - You may also stop by our office during regular business hours and pick up a GoodRx coupon card.  - If you need your prescription sent electronically to a different pharmacy, notify our office through [facility] or by phone at [phone]

## 2023-01-27 ENCOUNTER — Ambulatory Visit (INDEPENDENT_AMBULATORY_CARE_PROVIDER_SITE_OTHER): Payer: 59 | Admitting: Radiology

## 2023-01-27 ENCOUNTER — Ambulatory Visit
Admission: EM | Admit: 2023-01-27 | Discharge: 2023-01-27 | Disposition: A | Discharge status: Home / Self Care | Payer: 59 | Attending: Family Medicine | Admitting: Family Medicine

## 2023-01-27 DIAGNOSIS — M25572 Pain in left ankle and joints of left foot: Secondary | ICD-10-CM

## 2023-01-27 NOTE — ED Triage Notes (Signed)
 Pt presents with left ankle pain. Pt is ambulatory to triage, limping. The only injury patient could think of is stepping off curb wrong about two weeks ago. Pt states it was uncomfortable when it happened but it did not start hurting until about a week later. Unsure if it is a gout flare-up. Pt currently rates his pain a 5/10, describes as a constant pain, radiating up the calf. Pt does feel like there is a warmth to the left ankle. Two Tylenol & two Aleve taken this morning with no improvement.

## 2023-01-27 NOTE — ED Provider Notes (Signed)
 [facility]    CSN: [id number] Arrival date & time: 01/27/23  1014      History   Chief Complaint Chief Complaint  Patient presents with   Ankle Pain    HPI Dakota Golden is a 48 y.o. male.    Ankle Pain Associated symptoms: no fever   Left ankle pain onset last p.m. no known injury.  Pain is posterior lateral malleolus gradual onset around 8 PM last night.  Took a 1 mile walk yesterday does not recall an injury.  Admits local swelling, warmth and tenderness.  Took 2 naproxen this morning denies foot pain, knee pain, paresthesias, redness, history of similar symptoms, history of gout. 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 Authorizing Provider  amLODipine (NORVASC) 5 MG tablet Take by mouth. 12/20/22  Yes [provider]  rosuvastatin (CRESTOR) 10 MG tablet Take by mouth. 12/20/22  Yes [provider]  ASPIRIN 81 PO Take 1 tablet by mouth every morning.    [provider]  cetirizine (ZYRTEC) 10 MG tablet Take by mouth.    [provider]  ibuprofen (ADVIL,MOTRIN) 200 MG tablet Take 200 mg by mouth every 6 (six) hours as needed.    [provider]  nitroGLYCERIN  (NITRODUR - DOSED IN MG/24 HR) 0.2 mg/hr patch Place 1/4 to 1/2 of a patch over affected region. Remove and replace once daily.  Slightly alter skin placement daily 10/05/16   Marquette Ozell BIRCH, DO    Family History History reviewed. No  pertinent family history.  Social History Social History   Tobacco Use   Smoking status: Never   Smokeless tobacco: Never  Vaping Use   Vaping status: Never Used  Substance Use Topics   Alcohol use: Yes   Drug use: Never     Allergies   Sulfa antibiotics   Review of Systems Review of Systems  Constitutional:  Negative for fever.  Musculoskeletal:  Positive for arthralgias.  Skin:  Negative for color change and rash.     Physical Exam Triage Vital Signs ED Triage Vitals  Encounter Vitals Group     BP 01/27/23 1033 (!) 143/91     Systolic BP Percentile --      Diastolic BP Percentile --      Pulse Rate 01/27/23 1033 90     Resp 01/27/23 1033 18     Temp 01/27/23 1033 98 F (36.7 C)     Temp Source 01/27/23 1033 Oral     SpO2 01/27/23 1033 93 %     Weight 01/27/23 1031 203 lb (92.1 kg)     Height 01/27/23 1031 5' 9 (1.753 m)     Head Circumference --      Peak Flow --      Pain Score 01/27/23 1030 5     Pain Loc --  Pain Education --      Exclude from Growth Chart --    No data found.  Updated Vital Signs BP (!) 143/91 (BP Location: Right Arm)   Pulse 90   Temp 98 F (36.7 C) (Oral)   Resp 18   Ht 5' 9 (1.753 m)   Wt 203 lb (92.1 kg)   SpO2 93%   BMI 29.98 kg/m   Visual Acuity Right Eye Distance:   Left Eye Distance:   Bilateral Distance:    Right Eye Near:   Left Eye Near:    Bilateral Near:     Physical Exam Vitals and nursing note reviewed.  Constitutional:      Appearance: He is not ill-appearing.  HENT:     Head: Normocephalic and atraumatic.  Pulmonary:     Effort: Pulmonary effort is normal. No respiratory distress.  Musculoskeletal:     Left knee: Normal.     Left ankle: Tenderness present over the lateral malleolus. No base of 5th metatarsal or proximal fibula tenderness. Decreased range of motion.     Left Achilles Tendon: Normal. No tenderness or defects.     Left foot: Normal range of motion and normal capillary  refill. No swelling, prominent metatarsal heads, tenderness or bony tenderness. Normal pulse.  Skin:    General: Skin is warm and dry.  Neurological:     Mental Status: He is alert.      UC Treatments / Results  Labs (all labs ordered are listed, but only abnormal results are displayed) Labs Reviewed - No data to display  EKG   Radiology DG Ankle Complete Left Result Date: 01/27/2023 CLINICAL DATA:  [facility] posterior lateral malleolus EXAM: LEFT ANKLE COMPLETE - 3+ VIEW COMPARISON:  None Available. FINDINGS: Tiny avulsion fragment from the tip of the lateral malleolus appears well corticated consistent with remote avulsion injury. No evidence acute fracture. Ankle mortise intact. The talar dome is normal. IMPRESSION: No evidence of acute ankle fracture. Electronically Signed   By: Jackquline Boxer M.D.   On: 01/27/2023 11:00    Procedures Procedures (including critical care time)  Medications Ordered in UC Medications - No data to display  Initial Impression / Assessment and Plan / UC Course  I have reviewed the triage vital signs and the nursing notes.  Pertinent labs & imaging results that were available during my care of the patient were reviewed by me and considered in my medical decision making (see chart for details).     48 year old male with pain left lateral posterior ankle since last p.m., go walk yesterday but does not recall injuring it.  Admits possible injury 2 weeks ago but did not have pain at that time.  He has minimal swelling and mild tenderness on exam without erythema.  Ankle x-ray shows old avulsion fracture.  Given his level of pain and difficulty to bear weight we will treat with walking boot recommend he continue OTC NSAIDs and follow-up with PCP or Ortho Final Clinical Impressions(s) / UC Diagnoses   Final diagnoses:  Acute left ankle pain   Discharge Instructions   None    ED Prescriptions   None    PDMP not reviewed this encounter.    Joanthan Hlavacek, [place] 01/27/23 1109

## 2023-01-27 NOTE — Discharge Instructions (Signed)
 Continue over-the-counter naproxen Follow-up with your primary care doctor or orthopedist in 1 week if no improvement

## 2023-01-31 ENCOUNTER — Ambulatory Visit: Payer: 59 | Admitting: Orthopaedic Surgery

## 2023-01-31 DIAGNOSIS — M25572 Pain in left ankle and joints of left foot: Secondary | ICD-10-CM | POA: Diagnosis not present

## 2023-01-31 MED ORDER — PREDNISONE 10 MG (21) PO TBPK: ORAL_TABLET | ORAL | 3 refills | Status: AC

## 2023-01-31 NOTE — Progress Notes (Signed)
Office Visit Note   Patient: Dakota Golden           Date of Birth: 1975-02-26           MRN: [id number] Visit Date: 01/31/2023              Requested by: No referring provider defined for this encounter. PCP: Patient, No Pcp Per   Assessment & Plan: Visit Diagnoses:  1. Pain in left ankle and joints of left foot     Plan: Dakota Golden is a 48 year old gentleman with left peroneal tenosynovitis.  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  Otherwise x-rays are unremarkable.  Recommend continuing to mobilize with a cam boot and back off on activity as needed.  He can try some Voltaren gel.  Prednisone Dosepak prescribed.  Follow-up as needed.  Follow-Up Instructions: No follow-ups on file.   Orders:  No orders of the defined types were placed in this encounter.  Meds ordered this encounter  Medications   predniSONE (STERAPRED UNI-PAK 21 TAB) 10 MG (21) TBPK tablet    Sig: Take as directed    Dispense:  21 tablet    Refill:  3      Procedures: No procedures performed   Clinical Data: No additional findings.   Subjective: Chief Complaint  Patient presents with   Left Ankle - Pain    HPI Dakota Golden is a very pleasant 48 year old gentleman patient of mine who comes in for evaluation of lateral left ankle pain since Saturday.  Denies any injuries.  He reports having increased pain and swelling later that evening.  Went to the urgent care was given a cam boot.  Symptoms have improved.  X-rays were negative. Review of Systems  Constitutional: Negative.   HENT: Negative.    Eyes: Negative.   Respiratory: Negative.    Cardiovascular: Negative.   Gastrointestinal: Negative.   Endocrine: Negative.   Genitourinary: Negative.   Musculoskeletal:  Positive for arthralgias.  Skin: Negative.   Allergic/Immunologic: Negative.   Neurological: Negative.   Hematological: Negative.   Psychiatric/Behavioral: Negative.    All other  systems reviewed and are negative.    Objective: Vital Signs: There were no vitals taken for this visit.  Physical Exam Vitals and nursing note reviewed.  Constitutional:      Appearance: He is well-developed.  HENT:     Head: Normocephalic and atraumatic.  Eyes:     Pupils: Pupils are equal, round, and reactive to light.  Pulmonary:     Effort: Pulmonary effort is normal.  Abdominal:     Palpations: Abdomen is soft.  Musculoskeletal:        General: Normal range of motion.     Cervical back: Neck supple.  Skin:    General: Skin is warm.  Neurological:     Mental Status: He is alert and oriented to person, place, and time.  Psychiatric:        Behavior: Behavior normal.        Thought Content: Thought content normal.        Judgment: Judgment normal.     Ortho Exam Examination of the left ankle shows lateral swelling and tenderness along the peroneal tendons.  Pain along the posterior lateral aspect of the ankle with ankle plantarflexion.  No signs of infection. Specialty Comments:  No specialty comments available.  Imaging: No results found.   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  Past Surgical History:  Procedure Laterality Date   RADICAL ORCHIECTOMY  09/2007   Right   Social History   Occupational History   Not on file  Tobacco Use   Smoking status: Never   Smokeless tobacco: Never  Vaping Use   Vaping status: Never Used  Substance and Sexual Activity   Alcohol use: Yes   Drug use: Never   Sexual activity: Not on file

## 2023-11-18 ENCOUNTER — Encounter: Payer: Self-pay | Admitting: Radiology
